# Patient Record
Sex: Male | Born: 1980 | Race: White | Hispanic: No | Smoking: Former smoker
Health system: Southern US, Community
[De-identification: ages and names within clinical notes are randomized; demographics above are authoritative.]

## PROBLEM LIST (undated history)

## (undated) DIAGNOSIS — F32A Depression, unspecified: Secondary | ICD-10-CM

## (undated) DIAGNOSIS — F319 Bipolar disorder, unspecified: Secondary | ICD-10-CM

## (undated) DIAGNOSIS — F329 Major depressive disorder, single episode, unspecified: Secondary | ICD-10-CM

## (undated) DIAGNOSIS — F419 Anxiety disorder, unspecified: Secondary | ICD-10-CM

## (undated) HISTORY — PX: CHOLECYSTECTOMY: SHX55

---

## 2008-11-13 ENCOUNTER — Emergency Department (HOSPITAL_COMMUNITY): Admission: EM | Admit: 2008-11-13 | Discharge: 2008-11-13 | Payer: Self-pay | Admitting: Emergency Medicine

## 2008-11-15 ENCOUNTER — Ambulatory Visit (HOSPITAL_COMMUNITY): Admission: RE | Admit: 2008-11-15 | Discharge: 2008-11-15 | Payer: Self-pay | Admitting: Emergency Medicine

## 2009-04-21 ENCOUNTER — Emergency Department (HOSPITAL_COMMUNITY): Admission: EM | Admit: 2009-04-21 | Discharge: 2009-04-21 | Payer: Self-pay | Admitting: Emergency Medicine

## 2011-01-25 ENCOUNTER — Ambulatory Visit (HOSPITAL_COMMUNITY)
Admission: RE | Admit: 2011-01-25 | Discharge: 2011-01-25 | Disposition: A | Discharge status: Home / Self Care | Payer: Self-pay | Attending: Psychiatry | Admitting: Psychiatry

## 2011-02-02 LAB — RAPID URINE DRUG SCREEN, HOSP PERFORMED
Benzodiazepines: POSITIVE — AB
Cocaine: NOT DETECTED
Opiates: NOT DETECTED
Tetrahydrocannabinol: POSITIVE — AB

## 2011-02-02 LAB — POCT I-STAT, CHEM 8
Chloride: 107 mEq/L (ref 96–112)
HCT: 51 % (ref 39.0–52.0)
Hemoglobin: 17.3 g/dL — ABNORMAL HIGH (ref 13.0–17.0)
Potassium: 4 mEq/L (ref 3.5–5.1)
Sodium: 141 mEq/L (ref 135–145)

## 2011-02-02 LAB — URINALYSIS, ROUTINE W REFLEX MICROSCOPIC
Bilirubin Urine: NEGATIVE
Ketones, ur: 15 mg/dL — AB
Leukocytes, UA: NEGATIVE
Nitrite: NEGATIVE
Protein, ur: NEGATIVE mg/dL

## 2011-02-02 LAB — DIFFERENTIAL
Basophils Absolute: 0 10*3/uL (ref 0.0–0.1)
Basophils Relative: 0 % (ref 0–1)
Eosinophils Absolute: 0 10*3/uL (ref 0.0–0.7)
Monocytes Relative: 6 % (ref 3–12)
Neutro Abs: 15.9 10*3/uL — ABNORMAL HIGH (ref 1.7–7.7)
Neutrophils Relative %: 87 % — ABNORMAL HIGH (ref 43–77)

## 2011-02-02 LAB — BASIC METABOLIC PANEL
BUN: 10 mg/dL (ref 6–23)
CO2: 22 mEq/L (ref 19–32)
Calcium: 9.2 mg/dL (ref 8.4–10.5)
Chloride: 106 mEq/L (ref 96–112)
Creatinine, Ser: 1.19 mg/dL (ref 0.4–1.5)
GFR calc Af Amer: >60 mL/min (ref >60)

## 2011-02-02 LAB — URINE MICROSCOPIC-ADD ON

## 2011-02-02 LAB — CBC
MCHC: 33.2 g/dL (ref 30.0–36.0)
MCV: 91.6 fL (ref 78.0–100.0)
Platelets: 279 10*3/uL (ref 150–400)
RBC: 5.36 MIL/uL (ref 4.22–5.81)

## 2011-03-03 NOTE — Procedures (Signed)
EEG NUMBER:  [id number]   HISTORY:  This is a 30 year old patient with an episode of possible  seizure event.  This is a routine EEG.  No skull defects were noted.   MEDICATIONS:  Xanax.   EEG CLASSIFICATION:  Normal awake and asleep.   DESCRIPTION OF RECORDING:  Background rhythm of this consists of a  fairly well-modulated medium amplitude alpha rhythm of 9 Hz that is  reactive to eye opening and closure.  As the record progresses, the  patient appears to remain in the waking state throughout the recording  until the very end which he enters the drowsy state and may enters early  stage II sleep with vertex sharp wave activity seen.  Photic stimulation  is performed resulting in a bilateral and symmetric photic driving  response.  Hyperventilation is also performed resulting in a minimal  buildup of back rhythm activities without significant slowing seen.  At  no time during the recording does there appear to be evidence of spike  or spike wave discharges or evidence of focal slowing.  EKG monitor  shows no evidence of cardiac rhythm abnormalities with a heart rate of  78.   IMPRESSION:  This is a normal EEG recording in awake and sleeping state.  No evidence of ictal or interictal discharges were seen.      Marlan Palau, M.D.  Electronically Signed     QIO:NGEX  D:  11/15/2008 15:27:52  T:  11/16/2008 04:14:19  Job #:  [id number]

## 2012-07-30 ENCOUNTER — Encounter (HOSPITAL_COMMUNITY): Payer: Self-pay | Admitting: Emergency Medicine

## 2012-07-30 ENCOUNTER — Emergency Department (INDEPENDENT_AMBULATORY_CARE_PROVIDER_SITE_OTHER)
Admission: EM | Admit: 2012-07-30 | Discharge: 2012-07-30 | Disposition: A | Discharge status: Home / Self Care | Payer: Self-pay | Source: Home / Self Care | Attending: Family Medicine | Admitting: Family Medicine

## 2012-07-30 DIAGNOSIS — M549 Dorsalgia, unspecified: Secondary | ICD-10-CM

## 2012-07-30 HISTORY — DX: Anxiety disorder, unspecified: F41.9

## 2012-07-30 HISTORY — DX: Depression, unspecified: F32.A

## 2012-07-30 HISTORY — DX: Bipolar disorder, unspecified: F31.9

## 2012-07-30 HISTORY — DX: Major depressive disorder, single episode, unspecified: F32.9

## 2012-07-30 MED ORDER — CYCLOBENZAPRINE HCL 10 MG PO TABS: 10.0000 mg | ORAL_TABLET | Freq: Two times a day (BID) | ORAL | Status: DC | PRN

## 2012-07-30 MED ORDER — IBUPROFEN 800 MG PO TABS: 800.0000 mg | ORAL_TABLET | Freq: Three times a day (TID) | ORAL | Status: DC

## 2012-07-30 MED ORDER — HYDROCODONE-ACETAMINOPHEN 5-325 MG PO TABS: 2.0000 | ORAL_TABLET | ORAL | Status: DC | PRN

## 2012-07-30 MED ORDER — HYDROCODONE-ACETAMINOPHEN 5-325 MG PO TABS
2.0000 | ORAL_TABLET | Freq: Once | ORAL | Status: AC
  Administered 2012-07-30: 2 via ORAL

## 2012-07-30 MED ORDER — HYDROCODONE-ACETAMINOPHEN 5-325 MG PO TABS
ORAL_TABLET | ORAL | Status: AC
  Filled 2012-07-30: qty 2

## 2012-07-30 NOTE — ED Notes (Signed)
Pt c/o lower back pain since last night... Pt unable to walk and needing assistance, strong odor to urine.... Denies: fevers, vomiting, nauseas, diarrhea, weak, SOB... Pt is alert w/no signs of distress.

## 2012-07-30 NOTE — ED Provider Notes (Signed)
History     CSN: [id number]  Arrival date & time 07/30/12  1215   None     Chief Complaint  Patient presents with  . Back Pain    (Consider location/radiation/quality/duration/timing/severity/associated sxs/prior treatment) Patient is a 31 y.o. male presenting with back pain. The history is provided by the patient. No language interpreter was used.  Back Pain  This is a new problem. The current episode started yesterday. The problem occurs constantly. The problem has been gradually worsening. The pain is associated with no known injury. The pain is present in the lumbar spine. The quality of the pain is described as shooting and aching. The pain is at a severity of 7/10. The pain is moderate. The symptoms are aggravated by bending. Stiffness is present all day. He has tried nothing for the symptoms.    Past Medical History  Diagnosis Date  . Anxiety   . Bipolar 1 disorder   . Depression     Past Surgical History  Procedure Date  . Cholecystectomy     No family history on file.  History  Substance Use Topics  . Smoking status: Current Every Day Smoker -- 0.5 packs/day    Types: Cigarettes  . Smokeless tobacco: Not on file  . Alcohol Use: No      Review of Systems  Musculoskeletal: Positive for back pain.  All other systems reviewed and are negative.    Allergies  Review of patient's allergies indicates no known allergies.  Home Medications   Current Outpatient Rx  Name Route Sig Dispense Refill  . HYDROXYZINE HCL 25 MG PO TABS Oral Take 25 mg by mouth 3 (three) times daily as needed.    Marland Kitchen LATUDA PO Oral Take by mouth.      BP 127/77  Pulse 73  Temp 97.9 F (36.6 C) (Oral)  Resp 18  SpO2 99%  Physical Exam  Nursing note and vitals reviewed. Constitutional: He is oriented to person, place, and time. He appears well-developed and well-nourished.  HENT:  Head: Normocephalic and atraumatic.  Eyes: Conjunctivae normal are normal. Pupils are equal,  round, and reactive to light.  Cardiovascular: Normal rate and normal heart sounds.   Pulmonary/Chest: Effort normal.  Abdominal: Soft.  Musculoskeletal: Normal range of motion.  Neurological: He is alert and oriented to person, place, and time. He has normal reflexes.  Skin: Skin is warm.  Psychiatric: He has a normal mood and affect.    ED Course  Procedures (including critical care time)  Labs Reviewed - No data to display No results found.   No diagnosis found.    MDM  Rx for hydrocodone,  Ibuprofen and flexeril.   Pt advised to see Dr. Ophelia Charter for recheck this week        Elson Areas, [place] 07/30/12 1531

## 2012-07-31 LAB — POCT URINALYSIS DIP (DEVICE)
Bilirubin Urine: NEGATIVE
Glucose, UA: NEGATIVE mg/dL
Ketones, ur: NEGATIVE mg/dL
Leukocytes, UA: NEGATIVE
Protein, ur: NEGATIVE mg/dL
Specific Gravity, Urine: 1.025 (ref 1.005–1.030)

## 2012-08-01 NOTE — ED Provider Notes (Signed)
Medical screening examination/treatment/procedure(s) were performed by non-physician practitioner and as supervising physician I was immediately available for consultation/collaboration.  Leslee Home, M.D.   Reuben Likes, MD 08/01/12 0800

## 2013-03-16 ENCOUNTER — Encounter (HOSPITAL_COMMUNITY): Payer: Self-pay | Admitting: *Deleted

## 2013-03-16 ENCOUNTER — Emergency Department (HOSPITAL_COMMUNITY)
Admission: EM | Admit: 2013-03-16 | Discharge: 2013-03-16 | Disposition: A | Discharge status: Home / Self Care | Payer: Medicaid Other | Attending: Emergency Medicine | Admitting: Emergency Medicine

## 2013-03-16 DIAGNOSIS — M5380 Other specified dorsopathies, site unspecified: Secondary | ICD-10-CM | POA: Insufficient documentation

## 2013-03-16 DIAGNOSIS — Z79899 Other long term (current) drug therapy: Secondary | ICD-10-CM | POA: Insufficient documentation

## 2013-03-16 DIAGNOSIS — M545 Low back pain, unspecified: Secondary | ICD-10-CM | POA: Insufficient documentation

## 2013-03-16 DIAGNOSIS — M62830 Muscle spasm of back: Secondary | ICD-10-CM

## 2013-03-16 DIAGNOSIS — Z8659 Personal history of other mental and behavioral disorders: Secondary | ICD-10-CM | POA: Insufficient documentation

## 2013-03-16 DIAGNOSIS — F17200 Nicotine dependence, unspecified, uncomplicated: Secondary | ICD-10-CM | POA: Insufficient documentation

## 2013-03-16 MED ORDER — IBUPROFEN 800 MG PO TABS: 800.0000 mg | ORAL_TABLET | Freq: Three times a day (TID) | ORAL | Status: DC

## 2013-03-16 MED ORDER — HYDROCODONE-ACETAMINOPHEN 5-325 MG PO TABS: ORAL_TABLET | ORAL | Status: DC

## 2013-03-16 MED ORDER — CYCLOBENZAPRINE HCL 10 MG PO TABS: 10.0000 mg | ORAL_TABLET | Freq: Two times a day (BID) | ORAL | Status: DC | PRN

## 2013-03-16 NOTE — ED Notes (Signed)
Pt c/o right lower back pain. States he went for "a long walk" 4 days ago and woke up with back spasms.

## 2013-03-16 NOTE — ED Notes (Signed)
Pt reports hx of back muscle spasms.  Has taken flexeril, ibuprofen, vicodin without relief.  States (R) lower back pain.

## 2013-03-16 NOTE — ED Provider Notes (Signed)
History  This chart was scribed for non-physician practitioner, Hector Shade, working with Dione Booze, MD by Ardeen Jourdain, ED Scribe. This patient was seen in room TR06C/TR06C and the patient's care was started at 1705.  CSN: [id number]  Arrival date & time 03/16/13  1538   First MD Initiated Contact with Patient 03/16/13 1705      Chief Complaint  Patient presents with  . Back Pain     The history is provided by the patient. No language interpreter was used.    HPI Comments: Evan Thompson is a 32 y.o. male who presents to the Emergency Department complaining of gradual onset, gradually worsening, intermittent back pain that began 2-3 days ago. He states the pain radiates up to his upper back. He rates the pain at a 7/10 currently. He describes the pain as a sharp sensation. He denies any injury or heavy lifting. He reports going on a long walk the day before the pain began and believes the pain was caused by that. He states he has been taking Flexeril with relief. He denies any numbness and tingling of the groin, bladder incontinence or bowel incontinence as associated symptoms.    Past Medical History  Diagnosis Date  . Anxiety   . Bipolar 1 disorder   . Depression     Past Surgical History  Procedure Laterality Date  . Cholecystectomy      History reviewed. No pertinent family history.  History  Substance Use Topics  . Smoking status: Current Every Day Smoker -- 0.50 packs/day    Types: Cigarettes  . Smokeless tobacco: Not on file  . Alcohol Use: No      Review of Systems  Musculoskeletal: Positive for back pain.  All other systems reviewed and are negative.    Allergies  Review of patient's allergies indicates no known allergies.  Home Medications   Current Outpatient Rx  Name  Route  Sig  Dispense  Refill  . cyclobenzaprine (FLEXERIL) 10 MG tablet   Oral   Take 1 tablet (10 mg total) by mouth 2 (two) times daily as needed for muscle spasms.  20 tablet   0   . HYDROcodone-acetaminophen (NORCO/VICODIN) 5-325 MG per tablet   Oral   Take 1 tablet by mouth daily as needed for pain.         Marland Kitchen ibuprofen (ADVIL,MOTRIN) 800 MG tablet   Oral   Take 800 mg by mouth daily as needed for pain.         . cyclobenzaprine (FLEXERIL) 10 MG tablet   Oral   Take 1 tablet (10 mg total) by mouth 2 (two) times daily as needed for muscle spasms.   20 tablet   0   . HYDROcodone-acetaminophen (NORCO/VICODIN) 5-325 MG per tablet      Take 1-2 pills every 6 hours as needed for pain.   6 tablet   0   . ibuprofen (ADVIL,MOTRIN) 800 MG tablet   Oral   Take 1 tablet (800 mg total) by mouth 3 (three) times daily.   21 tablet   0     Triage Vitals: BP 134/83  Pulse 91  Temp(Src) 98.7 F (37.1 C) (Oral)  Resp 18  Ht 6\' 1"  (1.854 m)  Wt 245 lb (111.131 kg)  BMI 32.33 kg/m2  SpO2 97%  Physical Exam  Nursing note and vitals reviewed. Constitutional: He is oriented to person, place, and time. He appears well-developed and well-nourished. No distress.  HENT:  Head: Normocephalic and  atraumatic.  Eyes: EOM are normal. Pupils are equal, round, and reactive to light.  Neck: Normal range of motion. Neck supple. No tracheal deviation present.  Cardiovascular: Normal rate.   Pulmonary/Chest: Effort normal. No respiratory distress.  Abdominal: Soft. He exhibits no distension.  Musculoskeletal: Normal range of motion. He exhibits tenderness. He exhibits no edema.  Antalgic gait. TTP lumbar region on the right side. No bony tenderness along lumbar spine. Pain with movement   Neurological: He is alert and oriented to person, place, and time.  Skin: Skin is warm and dry. No rash noted.  Psychiatric: He has a normal mood and affect. His behavior is normal.    ED Course  Procedures (including critical care time)  DIAGNOSTIC STUDIES: Oxygen Saturation is 97% on room air, normal by my interpretation.    COORDINATION OF CARE:  5:26  PM-Discussed treatment plan which includes pain medication and muscle relaxants with pt at bedside and pt agreed to plan.    Labs Reviewed - No data to display No results found.   1. LBP (low back pain)   2. Muscle spasm of back       MDM  Pt has hx of back muscle spasm, was treated for similar type pain several months ago.  Pt has no red flag symptoms.  No hx of trauma.  No imaging necessary at this time.    Rx: norco, flexeril, and ibuprofen.  Provided pt education on back exercises, muscles spasms, and back injury prevention.   Provided pt info on [facility] to make an appointment as needed for continued back problems.   I personally performed the services described in this documentation, which was scribed in my presence. The recorded information has been reviewed and is accurate.      Junius Finner, PA-C 03/17/13 2047

## 2013-03-18 NOTE — ED Provider Notes (Signed)
Medical screening examination/treatment/procedure(s) were performed by non-physician practitioner and as supervising physician I was immediately available for consultation/collaboration.  Elodia Haviland, MD 03/18/13 0039 

## 2013-03-30 ENCOUNTER — Other Ambulatory Visit: Payer: Self-pay | Admitting: Internal Medicine

## 2013-03-30 DIAGNOSIS — C220 Liver cell carcinoma: Secondary | ICD-10-CM

## 2013-04-06 ENCOUNTER — Ambulatory Visit
Admission: RE | Admit: 2013-04-06 | Discharge: 2013-04-06 | Disposition: A | Discharge status: Home / Self Care | Payer: Medicaid Other | Source: Ambulatory Visit | Attending: Internal Medicine | Admitting: Internal Medicine

## 2013-04-06 DIAGNOSIS — C220 Liver cell carcinoma: Secondary | ICD-10-CM

## 2013-04-18 ENCOUNTER — Emergency Department (HOSPITAL_COMMUNITY)
Admission: EM | Admit: 2013-04-18 | Discharge: 2013-04-18 | Disposition: A | Discharge status: Home / Self Care | Payer: Medicaid Other | Attending: Emergency Medicine | Admitting: Emergency Medicine

## 2013-04-18 ENCOUNTER — Encounter (HOSPITAL_COMMUNITY): Payer: Self-pay | Admitting: *Deleted

## 2013-04-18 DIAGNOSIS — Y929 Unspecified place or not applicable: Secondary | ICD-10-CM | POA: Insufficient documentation

## 2013-04-18 DIAGNOSIS — F17200 Nicotine dependence, unspecified, uncomplicated: Secondary | ICD-10-CM | POA: Insufficient documentation

## 2013-04-18 DIAGNOSIS — Z79899 Other long term (current) drug therapy: Secondary | ICD-10-CM | POA: Insufficient documentation

## 2013-04-18 DIAGNOSIS — X58XXXA Exposure to other specified factors, initial encounter: Secondary | ICD-10-CM | POA: Insufficient documentation

## 2013-04-18 DIAGNOSIS — S39012A Strain of muscle, fascia and tendon of lower back, initial encounter: Secondary | ICD-10-CM

## 2013-04-18 DIAGNOSIS — Y939 Activity, unspecified: Secondary | ICD-10-CM | POA: Insufficient documentation

## 2013-04-18 DIAGNOSIS — S339XXA Sprain of unspecified parts of lumbar spine and pelvis, initial encounter: Secondary | ICD-10-CM | POA: Insufficient documentation

## 2013-04-18 DIAGNOSIS — Z8659 Personal history of other mental and behavioral disorders: Secondary | ICD-10-CM | POA: Insufficient documentation

## 2013-04-18 MED ORDER — HYDROCODONE-ACETAMINOPHEN 5-325 MG PO TABS
1.0000 | ORAL_TABLET | Freq: Once | ORAL | Status: AC
  Administered 2013-04-18: 1 via ORAL
  Filled 2013-04-18: qty 1

## 2013-04-18 MED ORDER — NAPROXEN 500 MG PO TABS: 500.0000 mg | ORAL_TABLET | Freq: Two times a day (BID) | ORAL | Status: DC

## 2013-04-18 MED ORDER — HYDROCODONE-ACETAMINOPHEN 5-325 MG PO TABS: ORAL_TABLET | ORAL | Status: DC

## 2013-04-18 MED ORDER — METHOCARBAMOL 500 MG PO TABS: 1000.0000 mg | ORAL_TABLET | Freq: Four times a day (QID) | ORAL | Status: DC

## 2013-04-18 NOTE — ED Provider Notes (Signed)
History    CSN: [id number] Arrival date & time 04/18/13  0801  First MD Initiated Contact with Patient 04/18/13 [phone]     Chief Complaint  Patient presents with  . Back Pain   (Consider location/radiation/quality/duration/timing/severity/associated sxs/prior Treatment) HPI Comments: Patient presents with a flare of lower back pain, right greater than left, for the past 2 days. Patient has had several flares over the past few months similar to this. Patient describes the pain as aching and does not radiate. Patient denies red flag signs and symptoms of lower back pain. He has been taking left over Flexeril and 800 mg ibuprofen without relief. Patient denies acute injury but states that he was swimming prior to when the pain started. The onset of this condition was acute. The course is constant. Aggravating factors: movement. Alleviating factors: none.    Patient is a 32 y.o. male presenting with back pain. The history is provided by the patient.  Back Pain Associated symptoms: no fever, no numbness and no weakness    Past Medical History  Diagnosis Date  . Anxiety   . Bipolar 1 disorder   . Depression    Past Surgical History  Procedure Laterality Date  . Cholecystectomy     No family history on file. History  Substance Use Topics  . Smoking status: Current Every Day Smoker -- 0.50 packs/day    Types: Cigarettes  . Smokeless tobacco: Not on file  . Alcohol Use: No    Review of Systems  Constitutional: Negative for fever and unexpected weight change.  Gastrointestinal: Negative for constipation.       Neg for fecal incontinence  Genitourinary: Negative for hematuria, flank pain and difficulty urinating.       Negative for urinary incontinence or retention  Musculoskeletal: Positive for back pain.  Neurological: Negative for weakness and numbness.       Negative for saddle paresthesias     Allergies  Review of patient's allergies indicates no known allergies.  Home  Medications   Current Outpatient Rx  Name  Route  Sig  Dispense  Refill  . cyclobenzaprine (FLEXERIL) 10 MG tablet   Oral   Take 1 tablet (10 mg total) by mouth 2 (two) times daily as needed for muscle spasms.   20 tablet   0   . ibuprofen (ADVIL,MOTRIN) 800 MG tablet   Oral   Take 1 tablet (800 mg total) by mouth 3 (three) times daily.   21 tablet   0   . ranitidine (ZANTAC) 150 MG tablet   Oral   Take 150 mg by mouth daily as needed for heartburn.         Marland Kitchen HYDROcodone-acetaminophen (NORCO/VICODIN) 5-325 MG per tablet      Take 1-2 tablets every 6 hours as needed for severe pain   10 tablet   0   . methocarbamol (ROBAXIN) 500 MG tablet   Oral   Take 2 tablets (1,000 mg total) by mouth 4 (four) times daily.   20 tablet   0   . naproxen (NAPROSYN) 500 MG tablet   Oral   Take 1 tablet (500 mg total) by mouth 2 (two) times daily.   20 tablet   0    BP 130/70  Pulse 74  Temp(Src) 98.2 F (36.8 C) (Oral)  SpO2 100% Physical Exam  Nursing note and vitals reviewed. Constitutional: He appears well-developed and well-nourished.  HENT:  Head: Normocephalic and atraumatic.  Eyes: Conjunctivae are normal.  Neck: Normal  range of motion.  Abdominal: Soft. There is no tenderness. There is no CVA tenderness.  Musculoskeletal: Normal range of motion. He exhibits no tenderness.       Back:  No step-off noted with palpation of spine.   Neurological: He is alert. He has normal reflexes. No sensory deficit. He exhibits normal muscle tone.  5/5 strength in entire lower extremities bilaterally. No sensation deficit.   Skin: Skin is warm and dry.  Psychiatric: He has a normal mood and affect.    ED Course  Procedures (including critical care time) Labs Reviewed - No data to display No results found. 1. Lumbosacral strain, initial encounter    10:01 AM Patient seen and examined. Work-up initiated. Medications ordered.   Vital signs reviewed and are as follows: Filed  Vitals:   04/18/13 0804  BP: 130/70  Pulse: 74  Temp: 98.2 F (36.8 C)   No red flag s/s of low back pain. Patient was counseled on back pain precautions and told to do activity as tolerated but do not lift, push, or pull heavy objects more than 10 pounds for the next week.  Patient counseled to use ice or heat on back for no longer than 15 minutes every hour.   Patient prescribed muscle relaxer and counseled on proper use of muscle relaxant medication.    Patient prescribed narcotic pain medicine and counseled on proper use of narcotic pain medications. Counseled not to combine this medication with others containing tylenol.   Urged patient not to drink alcohol, drive, or perform any other activities that requires focus while taking either of these medications.  Patient urged to follow-up with PCP if pain does not improve with treatment and rest or if pain becomes recurrent. Urged to return with worsening severe pain, loss of bowel or bladder control, trouble walking.   The patient verbalizes understanding and agrees with the plan.  MDM  Patient with back pain. No neurological deficits. Patient is ambulatory. No warning symptoms of back pain including: loss of bowel or bladder control, night sweats, waking from sleep with back pain, unexplained fevers or weight loss, h/o cancer, IVDU, recent trauma. No concern for cauda equina, epidural abscess, or other serious cause of back pain. Conservative measures such as rest, ice/heat and pain medicine indicated with PCP follow-up if no improvement with conservative management.    Renne Crigler, PA-C 04/18/13 1004

## 2013-04-18 NOTE — ED Provider Notes (Signed)
Medical screening examination/treatment/procedure(s) were performed by non-physician practitioner and as supervising physician I was immediately available for consultation/collaboration.  Thierry Dobosz, MD 04/18/13 1324 

## 2013-04-18 NOTE — ED Notes (Signed)
Pt is here with lower back pain for the last 2 days and tried taking flexeril and ibuprofen from previous visit. No abdominal pain or pain with urination.

## 2013-05-02 ENCOUNTER — Emergency Department (HOSPITAL_COMMUNITY)
Admission: EM | Admit: 2013-05-02 | Discharge: 2013-05-02 | Disposition: A | Discharge status: Home / Self Care | Payer: Medicaid Other | Attending: Emergency Medicine | Admitting: Emergency Medicine

## 2013-05-02 ENCOUNTER — Encounter (HOSPITAL_COMMUNITY): Payer: Self-pay | Admitting: *Deleted

## 2013-05-02 DIAGNOSIS — H109 Unspecified conjunctivitis: Secondary | ICD-10-CM

## 2013-05-02 DIAGNOSIS — Z79899 Other long term (current) drug therapy: Secondary | ICD-10-CM | POA: Insufficient documentation

## 2013-05-02 DIAGNOSIS — IMO0002 Reserved for concepts with insufficient information to code with codable children: Secondary | ICD-10-CM | POA: Insufficient documentation

## 2013-05-02 DIAGNOSIS — F17200 Nicotine dependence, unspecified, uncomplicated: Secondary | ICD-10-CM | POA: Insufficient documentation

## 2013-05-02 DIAGNOSIS — H5789 Other specified disorders of eye and adnexa: Secondary | ICD-10-CM | POA: Insufficient documentation

## 2013-05-02 DIAGNOSIS — Z8659 Personal history of other mental and behavioral disorders: Secondary | ICD-10-CM | POA: Insufficient documentation

## 2013-05-02 MED ORDER — CIPROFLOXACIN HCL 0.3 % OP SOLN: 1.0000 [drp] | OPHTHALMIC | Status: DC

## 2013-05-02 NOTE — ED Notes (Signed)
Pt c/o burning, itching eyes, that sometimes water, sometime produce exudate. Some relief with allergy medication. Denies vision difficulty

## 2013-05-02 NOTE — ED Provider Notes (Signed)
History    This chart was scribed for non-physician practitioner  Trevor Mace, PA-C, working with Laray Anger, DO by Donne Anon, ED Scribe. This patient was seen in room TR08C/TR08C and the patient's care was started at 1953.  CSN: [id number] Arrival date & time 05/02/13  1946  First MD Initiated Contact with Patient 05/02/13 1953     Chief Complaint  Patient presents with  . Burning Eyes    The history is provided by the patient. No language interpreter was used.   HPI Comments: Evan Thompson is a 32 y.o. male who presents to the Emergency Department complaining of a burning and itching left eye that sometimes waters and produces exudate. He has tried Benadryl that provided relief of the discharge but not of the itching. He denies blurred vision or any other pain. He wears contacts, and he notices that when he wears the contacts the itching becomes worse.   Past Medical History  Diagnosis Date  . Anxiety   . Bipolar 1 disorder   . Depression    Past Surgical History  Procedure Laterality Date  . Cholecystectomy     No family history on file. History  Substance Use Topics  . Smoking status: Current Every Day Smoker -- 0.50 packs/day    Types: Cigarettes  . Smokeless tobacco: Not on file  . Alcohol Use: No    Review of Systems  Eyes: Positive for discharge and itching. Negative for visual disturbance.  All other systems reviewed and are negative.    Allergies  Review of patient's allergies indicates no known allergies.  Home Medications   Current Outpatient Rx  Name  Route  Sig  Dispense  Refill  . cyclobenzaprine (FLEXERIL) 10 MG tablet   Oral   Take 1 tablet (10 mg total) by mouth 2 (two) times daily as needed for muscle spasms.   20 tablet   0   . HYDROcodone-acetaminophen (NORCO/VICODIN) 5-325 MG per tablet      Take 1-2 tablets every 6 hours as needed for severe pain   10 tablet   0   . ibuprofen (ADVIL,MOTRIN) 800 MG tablet   Oral   Take  1 tablet (800 mg total) by mouth 3 (three) times daily.   21 tablet   0   . methocarbamol (ROBAXIN) 500 MG tablet   Oral   Take 2 tablets (1,000 mg total) by mouth 4 (four) times daily.   20 tablet   0   . naproxen (NAPROSYN) 500 MG tablet   Oral   Take 1 tablet (500 mg total) by mouth 2 (two) times daily.   20 tablet   0   . ranitidine (ZANTAC) 150 MG tablet   Oral   Take 150 mg by mouth daily as needed for heartburn.          BP 131/85  Pulse 103  Temp(Src) 98.8 F (37.1 C) (Oral)  Resp 20  SpO2 98% Physical Exam  Nursing note and vitals reviewed. Constitutional: He is oriented to person, place, and time. He appears well-developed and well-nourished. No distress.  HENT:  Head: Normocephalic and atraumatic.  Eyes: EOM are normal. Pupils are equal, round, and reactive to light. Left eye exhibits discharge. Left conjunctiva is injected.  Left conjunctiva injected with yellow purulent drainage.  Neck: Normal range of motion. Neck supple.  Cardiovascular: Normal rate, regular rhythm and normal heart sounds.   Pulmonary/Chest: Effort normal and breath sounds normal.  Musculoskeletal: Normal range of  motion. He exhibits no edema.  Neurological: He is alert and oriented to person, place, and time.  Skin: Skin is warm and dry.  Psychiatric: He has a normal mood and affect. His behavior is normal.    ED Course  Procedures (including critical care time) DIAGNOSTIC STUDIES: Oxygen Saturation is 98% on RA, normal by my interpretation.    COORDINATION OF CARE: 7:59 PM Discussed treatment plan which includes antibiotic eye ointment with pt at bedside and pt agreed to plan. Advised pt to not use contacts until the antibiotic course is finished and to throw out the contacts that he is currently using.   Labs Reviewed - No data to display No results found. 1. Conjunctivitis     MDM  Patient with conjunctivitis. He is a contact lens wearer and will treat with Cipro.  Infection care precautions discussed. Patient is understanding of plan and is agreeable.    I personally performed the services described in this documentation, which was scribed in my presence. The recorded information has been reviewed and is accurate.   Trevor Mace, PA-C 05/02/13 2010

## 2013-05-03 NOTE — ED Provider Notes (Signed)
Medical screening examination/treatment/procedure(s) were performed by non-physician practitioner and as supervising physician I was immediately available for consultation/collaboration.   Jocabed Cheese M Gaynor Genco, DO 05/03/13 1550 

## 2013-12-06 ENCOUNTER — Emergency Department (HOSPITAL_COMMUNITY)
Admission: EM | Admit: 2013-12-06 | Discharge: 2013-12-06 | Disposition: A | Discharge status: Home / Self Care | Payer: Medicaid Other | Attending: Emergency Medicine | Admitting: Emergency Medicine

## 2013-12-06 ENCOUNTER — Encounter (HOSPITAL_COMMUNITY): Payer: Self-pay | Admitting: Emergency Medicine

## 2013-12-06 DIAGNOSIS — F411 Generalized anxiety disorder: Secondary | ICD-10-CM | POA: Insufficient documentation

## 2013-12-06 DIAGNOSIS — F319 Bipolar disorder, unspecified: Secondary | ICD-10-CM | POA: Insufficient documentation

## 2013-12-06 DIAGNOSIS — Z79899 Other long term (current) drug therapy: Secondary | ICD-10-CM | POA: Insufficient documentation

## 2013-12-06 DIAGNOSIS — Z791 Long term (current) use of non-steroidal anti-inflammatories (NSAID): Secondary | ICD-10-CM | POA: Insufficient documentation

## 2013-12-06 DIAGNOSIS — J020 Streptococcal pharyngitis: Secondary | ICD-10-CM | POA: Insufficient documentation

## 2013-12-06 DIAGNOSIS — F17200 Nicotine dependence, unspecified, uncomplicated: Secondary | ICD-10-CM | POA: Insufficient documentation

## 2013-12-06 LAB — RAPID STREP SCREEN (MED CTR MEBANE ONLY): Streptococcus, Group A Screen (Direct): POSITIVE — AB

## 2013-12-06 MED ORDER — MENTHOL 3 MG MT LOZG
1.0000 | LOZENGE | OROMUCOSAL | Status: DC | PRN
  Administered 2013-12-06: 3 mg via ORAL
  Filled 2013-12-06: qty 9

## 2013-12-06 MED ORDER — PENICILLIN G BENZATHINE 1200000 UNIT/2ML IM SUSP
1.2000 10*6.[IU] | Freq: Once | INTRAMUSCULAR | Status: AC
  Administered 2013-12-06: 1.2 10*6.[IU] via INTRAMUSCULAR
  Filled 2013-12-06: qty 2

## 2013-12-06 MED ORDER — ACETAMINOPHEN 325 MG PO TABS
650.0000 mg | ORAL_TABLET | Freq: Once | ORAL | Status: AC
  Administered 2013-12-06: 650 mg via ORAL
  Filled 2013-12-06: qty 2

## 2013-12-06 NOTE — ED Provider Notes (Signed)
CSN: [id number]     Arrival date & time 12/06/13  0448 History   First MD Initiated Contact with Patient 12/06/13 0459     Chief Complaint  Patient presents with  . Sore Throat     (Consider location/radiation/quality/duration/timing/severity/associated sxs/prior Treatment) HPI 33 year old male presents to emergency room with complaint of left-sided throat pain.  Pain with swallowing.  He does report some URI symptoms, such as runny nose cough, congestion.  He is unsure if he has had fever.  He is tried cough drops, without improvement. Past Medical History  Diagnosis Date  . Anxiety   . Bipolar 1 disorder   . Depression    Past Surgical History  Procedure Laterality Date  . Cholecystectomy     No family history on file. History  Substance Use Topics  . Smoking status: Current Every Day Smoker -- 0.50 packs/day    Types: Cigarettes  . Smokeless tobacco: Not on file  . Alcohol Use: No    Review of Systems  See History of Present Illness; otherwise all other systems are reviewed and negative   Allergies  Review of patient's allergies indicates no known allergies.  Home Medications   Current Outpatient Rx  Name  Route  Sig  Dispense  Refill  . ARIPiprazole (ABILIFY) 10 MG tablet   Oral   Take 10 mg by mouth daily.         . ciprofloxacin (CILOXAN) 0.3 % ophthalmic solution   Left Eye   Place 1 drop into the left eye every 2 (two) hours. Administer 2 drops in L eye every 4 hours x 5 days.   5 mL   0   . cyclobenzaprine (FLEXERIL) 10 MG tablet   Oral   Take 1 tablet (10 mg total) by mouth 2 (two) times daily as needed for muscle spasms.   20 tablet   0   . diphenhydrAMINE (BENADRYL) 25 MG tablet   Oral   Take 25 mg by mouth every 6 (six) hours as needed for itching.         . hydrOXYzine (ATARAX/VISTARIL) 25 MG tablet   Oral   Take 25 mg by mouth daily as needed for anxiety.         . methocarbamol (ROBAXIN) 500 MG tablet   Oral   Take 2 tablets  (1,000 mg total) by mouth 4 (four) times daily.   20 tablet   0   . naproxen (NAPROSYN) 500 MG tablet   Oral   Take 1 tablet (500 mg total) by mouth 2 (two) times daily.   20 tablet   0   . ranitidine (ZANTAC) 150 MG tablet   Oral   Take 150 mg by mouth daily as needed for heartburn.          BP 133/92  Pulse 105  Temp(Src) 98.4 F (36.9 C) (Oral)  Resp 18  SpO2 97% Physical Exam  Nursing note and vitals reviewed. Constitutional: He is oriented to person, place, and time. He appears well-developed and well-nourished.  HENT:  Head: Normocephalic and atraumatic.  Right Ear: External ear normal.  Left Ear: External ear normal.  Nose: Nose normal.  Mouth/Throat: Oropharyngeal exudate (mild erythema and exudate noted on bilateral tonsils.  No uvula deviationarea trismus.  Patient is handling secretions well) present.  Eyes: Conjunctivae and EOM are normal. Pupils are equal, round, and reactive to light.  Neck: Normal range of motion. Neck supple. No JVD present. No tracheal deviation present. No thyromegaly  present.  Cardiovascular: Normal rate, regular rhythm, normal heart sounds and intact distal pulses.  Exam reveals no gallop and no friction rub.   No murmur heard. Pulmonary/Chest: Effort normal and breath sounds normal. No stridor. No respiratory distress. He has no wheezes. He has no rales. He exhibits no tenderness.  Abdominal: Soft. Bowel sounds are normal. He exhibits no distension and no mass. There is no tenderness. There is no rebound and no guarding.  Musculoskeletal: Normal range of motion. He exhibits no edema and no tenderness.  Lymphadenopathy:    He has no cervical adenopathy.  Neurological: He is alert and oriented to person, place, and time. He exhibits normal muscle tone. Coordination normal.  Skin: Skin is warm and dry. No rash noted. No erythema. No pallor.  Psychiatric: He has a normal mood and affect. His behavior is normal. Judgment and thought content  normal.    ED Course  Procedures (including critical care time) Labs Review Labs Reviewed  RAPID STREP SCREEN - Abnormal; Notable for the following:    Streptococcus, Group A Screen (Direct) POSITIVE (*)    All other components within normal limits   Imaging Review No results found.  EKG Interpretation   None       MDM   Final diagnoses:  Strep throat    33 year old male with strep throat.  Patient given option of 10 days of antibiotics or IM penicillin.  He has no previous allergies.    Kalman Drape, MD 12/06/13 [phone]

## 2013-12-06 NOTE — ED Notes (Signed)
Pt reports left sided sore throat and is tender to push on the left side of his neck and it hurts to swallow.. Throat appears red and with white spots. Denies fever.

## 2013-12-06 NOTE — Discharge Instructions (Signed)
You were given a shot of penicillin today, which should cure your strep throat.  Return emergency room for worsening condition or new concerning symptoms.  Drink plenty of fluids.  Warm saltwater gargles will help with pain as will Cepacol Lozenges and/or throat numbing spray, available over-the-counter.   Pharyngitis Pharyngitis is redness, pain, and swelling (inflammation) of your pharynx.  CAUSES  Pharyngitis is usually caused by infection. Most of the time, these infections are from viruses (viral) and are part of a cold. However, sometimes pharyngitis is caused by bacteria (bacterial). Pharyngitis can also be caused by allergies. Viral pharyngitis may be spread from person to person by coughing, sneezing, and personal items or utensils (cups, forks, spoons, toothbrushes). Bacterial pharyngitis may be spread from person to person by more intimate contact, such as kissing.  SIGNS AND SYMPTOMS  Symptoms of pharyngitis include:   Sore throat.   Tiredness (fatigue).   Low-grade fever.   Headache.  Joint pain and muscle aches.  Skin rashes.  Swollen lymph nodes.  Plaque-like film on throat or tonsils (often seen with bacterial pharyngitis). DIAGNOSIS  Your health care provider will ask you questions about your illness and your symptoms. Your medical history, along with a physical exam, is often all that is needed to diagnose pharyngitis. Sometimes, a rapid strep test is done. Other lab tests may also be done, depending on the suspected cause.  TREATMENT  Viral pharyngitis will usually get better in 3 4 days without the use of medicine. Bacterial pharyngitis is treated with medicines that kill germs (antibiotics).  HOME CARE INSTRUCTIONS   Drink enough water and fluids to keep your urine clear or pale yellow.   Only take over-the-counter or prescription medicines as directed by your health care provider:   If you are prescribed antibiotics, make sure you finish them even if you  start to feel better.   Do not take aspirin.   Get lots of rest.   Gargle with 8 oz of salt water ( tsp of salt per 1 qt of water) as often as every 1 2 hours to soothe your throat.   Throat lozenges (if you are not at risk for choking) or sprays may be used to soothe your throat. SEEK MEDICAL CARE IF:   You have large, tender lumps in your neck.  You have a rash.  You cough up green, yellow-brown, or bloody spit. SEEK IMMEDIATE MEDICAL CARE IF:   Your neck becomes stiff.  You drool or are unable to swallow liquids.  You vomit or are unable to keep medicines or liquids down.  You have severe pain that does not go away with the use of recommended medicines.  You have trouble breathing (not caused by a stuffy nose). MAKE SURE YOU:   Understand these instructions.  Will watch your condition.  Will get help right away if you are not doing well or get worse. Document Released: 10/05/2005 Document Revised: 07/26/2013 Document Reviewed: 06/12/2013 [facility] Patient Information 2014 [place].  Salt Water Gargle This solution will help make your mouth and throat feel better. HOME CARE INSTRUCTIONS   Mix 1 teaspoon of salt in 8 ounces of warm water.  Gargle with this solution as much or often as you need or as directed. Swish and gargle gently if you have any sores or wounds in your mouth.  Do not swallow this mixture. Document Released: 07/09/2004 Document Revised: 12/28/2011 Document Reviewed: 11/30/2008 [facility] Patient Information 2014 [place].

## 2014-10-11 ENCOUNTER — Encounter (HOSPITAL_COMMUNITY): Payer: Self-pay

## 2014-10-11 ENCOUNTER — Emergency Department (HOSPITAL_COMMUNITY)
Admission: EM | Admit: 2014-10-11 | Discharge: 2014-10-11 | Disposition: A | Discharge status: Home / Self Care | Payer: Medicaid Other | Attending: Emergency Medicine | Admitting: Emergency Medicine

## 2014-10-11 DIAGNOSIS — F319 Bipolar disorder, unspecified: Secondary | ICD-10-CM | POA: Diagnosis not present

## 2014-10-11 DIAGNOSIS — K047 Periapical abscess without sinus: Secondary | ICD-10-CM | POA: Diagnosis not present

## 2014-10-11 DIAGNOSIS — Z720 Tobacco use: Secondary | ICD-10-CM | POA: Diagnosis not present

## 2014-10-11 DIAGNOSIS — Z79899 Other long term (current) drug therapy: Secondary | ICD-10-CM | POA: Diagnosis not present

## 2014-10-11 DIAGNOSIS — R630 Anorexia: Secondary | ICD-10-CM | POA: Diagnosis not present

## 2014-10-11 DIAGNOSIS — F419 Anxiety disorder, unspecified: Secondary | ICD-10-CM | POA: Diagnosis not present

## 2014-10-11 DIAGNOSIS — K029 Dental caries, unspecified: Secondary | ICD-10-CM | POA: Insufficient documentation

## 2014-10-11 DIAGNOSIS — K088 Other specified disorders of teeth and supporting structures: Secondary | ICD-10-CM | POA: Diagnosis present

## 2014-10-11 MED ORDER — TRAMADOL HCL 50 MG PO TABS
50.0000 mg | ORAL_TABLET | Freq: Once | ORAL | Status: AC
  Administered 2014-10-11: 50 mg via ORAL
  Filled 2014-10-11: qty 1

## 2014-10-11 MED ORDER — PENICILLIN V POTASSIUM 500 MG PO TABS: 500.0000 mg | ORAL_TABLET | Freq: Four times a day (QID) | ORAL | Status: DC

## 2014-10-11 MED ORDER — TRAMADOL HCL 50 MG PO TABS: 50.0000 mg | ORAL_TABLET | Freq: Three times a day (TID) | ORAL | Status: DC | PRN

## 2014-10-11 MED ORDER — PENICILLIN V POTASSIUM 500 MG PO TABS
500.0000 mg | ORAL_TABLET | Freq: Once | ORAL | Status: AC
  Administered 2014-10-11: 500 mg via ORAL
  Filled 2014-10-11: qty 1

## 2014-10-11 NOTE — ED Provider Notes (Signed)
CSN: [id number]     Arrival date & time 10/11/14  1905 History  This chart was scribed for non-physician practitioner working with Carmin Muskrat, MD, by Peyton Bottoms ED Scribe. This patient was seen in room WTR5/WTR5 and the patient's care was started at 8:18 PM  Chief Complaint  Patient presents with  . Dental Pain   The history is provided by the patient. No language interpreter was used.    HPI Comments: Evan Thompson is a 33 y.o. male who presents to the Emergency Department complaining of left upper tooth pain that began 3 days ago. He states that it hurts to sleep and eat. He has not been seen by the dentist yet.  Past Medical History  Diagnosis Date  . Anxiety   . Bipolar 1 disorder   . Depression    Past Surgical History  Procedure Laterality Date  . Cholecystectomy     History reviewed. No pertinent family history. History  Substance Use Topics  . Smoking status: Current Every Day Smoker -- 0.50 packs/day    Types: Cigarettes  . Smokeless tobacco: Not on file  . Alcohol Use: No   Review of Systems  Constitutional: Positive for appetite change. Negative for chills and fatigue.  HENT: Positive for dental problem. Negative for rhinorrhea and sore throat.   Respiratory: Negative for cough.   Cardiovascular: Negative for chest pain.  Gastrointestinal: Negative for nausea, vomiting and diarrhea.  Genitourinary: Negative for dysuria.  Musculoskeletal: Negative for back pain.  Skin: Negative for rash.  Psychiatric/Behavioral: Negative for confusion.  All other systems reviewed and are negative.  Allergies  Review of patient's allergies indicates no known allergies.  Home Medications   Prior to Admission medications   Medication Sig Start Date End Date Taking? Authorizing Provider  ibuprofen (ADVIL,MOTRIN) 200 MG tablet Take 400-1,000 mg by mouth every 6 (six) hours as needed (pain).   Yes Historical Provider, MD  ARIPiprazole (ABILIFY) 10 MG tablet Take 10 mg by  mouth daily.    Historical Provider, MD  hydrOXYzine (ATARAX/VISTARIL) 25 MG tablet Take 25 mg by mouth daily as needed for anxiety.    Historical Provider, MD  penicillin v potassium (VEETID) 500 MG tablet Take 1 tablet (500 mg total) by mouth 4 (four) times daily. 10/11/14   Garald Balding, NP  Pseudoeph-Doxylamine-DM-APAP 30-6.25-15-325 MG CAPS Take 2 capsules by mouth once.    Historical Provider, MD  traMADol (ULTRAM) 50 MG tablet Take 1 tablet (50 mg total) by mouth 3 (three) times daily as needed. 10/11/14   Garald Balding, NP   Triage Vitals: BP 116/72 mmHg  Pulse 88  Temp(Src) 98 F (36.7 C) (Oral)  SpO2 100%  Physical Exam  Constitutional: He is oriented to person, place, and time. He appears well-developed and well-nourished. No distress.  HENT:  Head: Normocephalic and atraumatic.  Mouth/Throat:    Abscess to left second molar.  Eyes: Conjunctivae and EOM are normal.  Neck: Neck supple. No tracheal deviation present.  Cardiovascular: Normal rate.   Pulmonary/Chest: Effort normal. No respiratory distress.  Musculoskeletal: Normal range of motion.  Neurological: He is alert and oriented to person, place, and time.  Skin: Skin is warm and dry.  Psychiatric: He has a normal mood and affect. His behavior is normal.  Nursing note and vitals reviewed.   ED Course  Procedures (including critical care time)  DIAGNOSTIC STUDIES: Oxygen Saturation is 100% on RA, normal by my interpretation.    COORDINATION OF CARE: 8:17 PM-  Discussed plans to give medication for pain management. Pt advised of plan for treatment and pt agrees.  Labs Review Labs Reviewed - No data to display  Imaging Review No results found.   EKG Interpretation None     MDM   Final diagnoses:  Dental abscess  Dental caries       I personally performed the services described in this documentation, which was scribed in my presence. The recorded information has been reviewed and is  accurate.  Garald Balding, NP 10/11/14 2030  Carmin Muskrat, MD 10/11/14 2103

## 2014-10-11 NOTE — Discharge Instructions (Signed)
Abscessed Tooth An abscessed tooth is an infection around your tooth. It may be caused by holes or damage to the tooth (cavity) or a dental disease. An abscessed tooth causes mild to very bad pain in and around the tooth. See your dentist right away if you have tooth or gum pain. HOME CARE  Take your medicine as told. Finish it even if you start to feel better.  Do not drive after taking pain medicine.  Rinse your mouth (gargle) often with salt water ( teaspoon salt in 8 ounces of warm water).  Do not apply heat to the outside of your face. GET HELP RIGHT AWAY IF:   You have a temperature by mouth above 102 F (38.9 C), not controlled by medicine.  You have chills and a very bad headache.  You have problems breathing or swallowing.  Your mouth will not open.  You develop puffiness (swelling) on the neck or around the eye.  Your pain is not helped by medicine.  Your pain is getting worse instead of better. MAKE SURE YOU:   Understand these instructions.  Will watch your condition.  Will get help right away if you are not doing well or get worse. Document Released: 03/23/2008 Document Revised: 12/28/2011 Document Reviewed: 01/13/2011 [facility] Patient Information 2015 [place], [place]. This information is not intended to replace advice given to you by your health care provider. Make sure you discuss any questions you have with your health care provider.  Dental Care and Dentist Visits Dental care supports good overall health. Regular dental visits can also help you avoid dental pain, bleeding, infection, and other more serious health problems in the future. It is important to keep the mouth healthy because diseases in the teeth, gums, and other oral tissues can spread to other areas of the body. Some problems, such as diabetes, heart disease, and pre-term labor have been associated with poor oral health.  See your dentist every 6 months. If you experience emergency problems such  as a toothache or broken tooth, go to the dentist right away. If you see your dentist regularly, you may catch problems early. It is easier to be treated for problems in the early stages.  WHAT TO EXPECT AT A DENTIST VISIT  Your dentist will look for many common oral health problems and recommend proper treatment. At your regular dental visit, you can expect:  Gentle cleaning of the teeth and gums. This includes scraping and polishing. This helps to remove the sticky substance around the teeth and gums (plaque). Plaque forms in the mouth shortly after eating. Over time, plaque hardens on the teeth as tartar. If tartar is not removed regularly, it can cause problems. Cleaning also helps remove stains.  Periodic X-rays. These pictures of the teeth and supporting bone will help your dentist assess the health of your teeth.  Periodic fluoride treatments. Fluoride is a natural mineral shown to help strengthen teeth. Fluoride treatmentinvolves applying a fluoride gel or varnish to the teeth. It is most commonly done in children.  Examination of the mouth, tongue, jaws, teeth, and gums to look for any oral health problems, such as:  Cavities (dental caries). This is decay on the tooth caused by plaque, sugar, and acid in the mouth. It is best to catch a cavity when it is small.  Inflammation of the gums caused by plaque buildup (gingivitis).  Problems with the mouth or malformed or misaligned teeth.  Oral cancer or other diseases of the soft tissues or jaws.  KEEP YOUR TEETH AND GUMS HEALTHY For healthy teeth and gums, follow these general guidelines as well as your dentist's specific advice:  Have your teeth professionally cleaned at the dentist every 6 months.  Brush twice daily with a fluoride toothpaste.  Floss your teeth daily.  Ask your dentist if you need fluoride supplements, treatments, or fluoride toothpaste.  Eat a healthy diet. Reduce foods and drinks with added sugar.  Avoid  smoking. TREATMENT FOR ORAL HEALTH PROBLEMS If you have oral health problems, treatment varies depending on the conditions present in your teeth and gums.  Your caregiver will most likely recommend good oral hygiene at each visit.  For cavities, gingivitis, or other oral health disease, your caregiver will perform a procedure to treat the problem. This is typically done at a separate appointment. Sometimes your caregiver will refer you to another dental specialist for specific tooth problems or for surgery. SEEK IMMEDIATE DENTAL CARE IF:  You have pain, bleeding, or soreness in the gum, tooth, jaw, or mouth area.  A permanent tooth becomes loose or separated from the gum socket.  You experience a blow or injury to the mouth or jaw area. Document Released: 06/17/2011 Document Revised: 12/28/2011 Document Reviewed: 06/17/2011 [facility] Patient Information 2015 [place], [place]. This information is not intended to replace advice given to you by your health care provider. Make sure you discuss any questions you have with your health care provider. Call the dentist and make an appointment  Tell them you are being referred through the ED

## 2014-10-11 NOTE — ED Notes (Signed)
Patient reports dental pain x 3 days that is affecting his sleeping and eating.  Dental caries noted.

## 2015-01-22 IMAGING — US US ABDOMEN LIMITED
1 series · 14 of 25 positions shown · non-contrast
Comparison: None.

CLINICAL DATA: Hepatitis C. Hepatoma screening

LIMITED ABDOMINAL ULTRASOUND

[Series 1: us abdomen limited · 0.29mm/px · 14 of 37 slices shown]
[im 1/37]
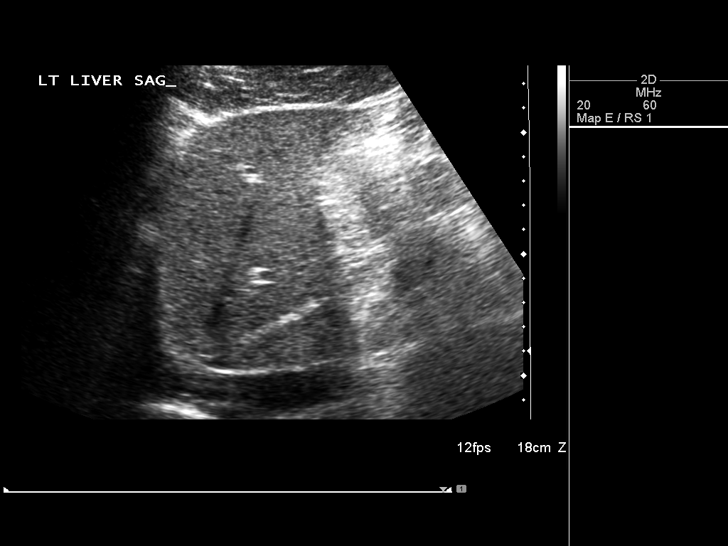
[im 4/37]
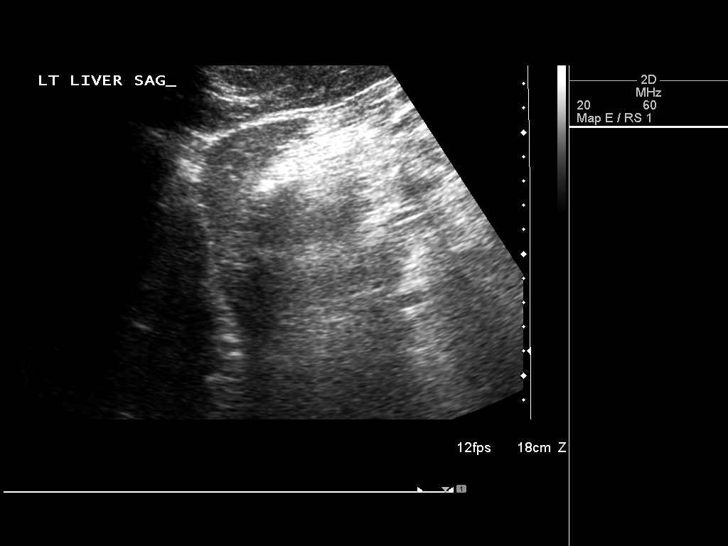
[im 7/37]
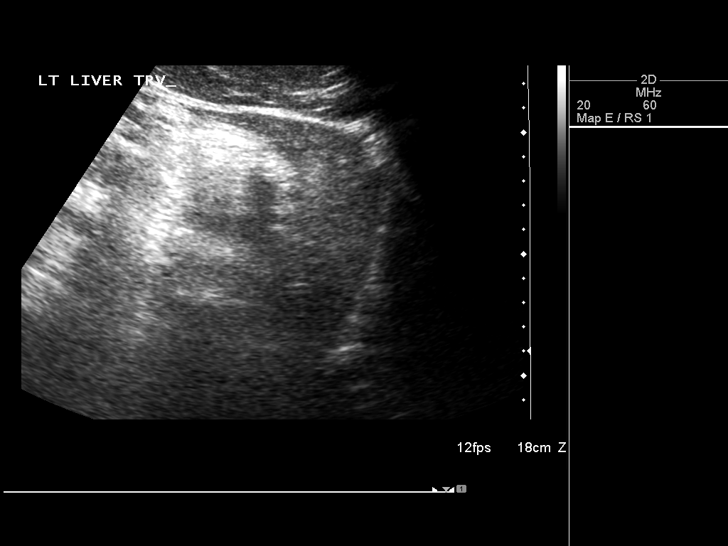
[im 10/37]
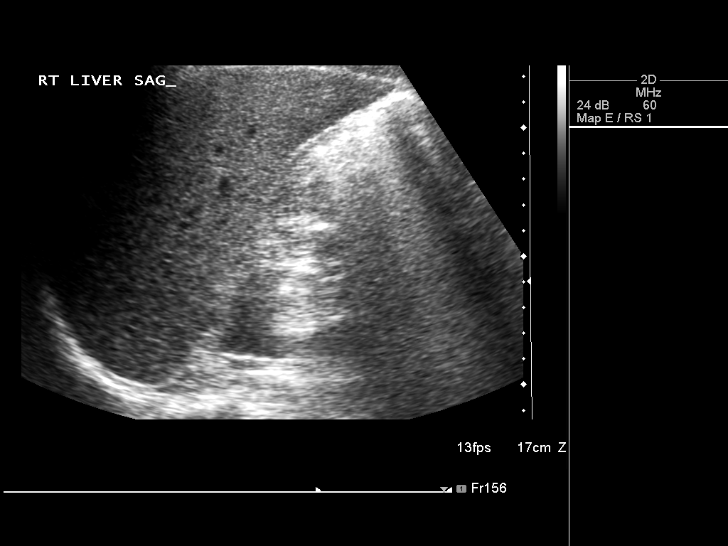
[im 13/37]
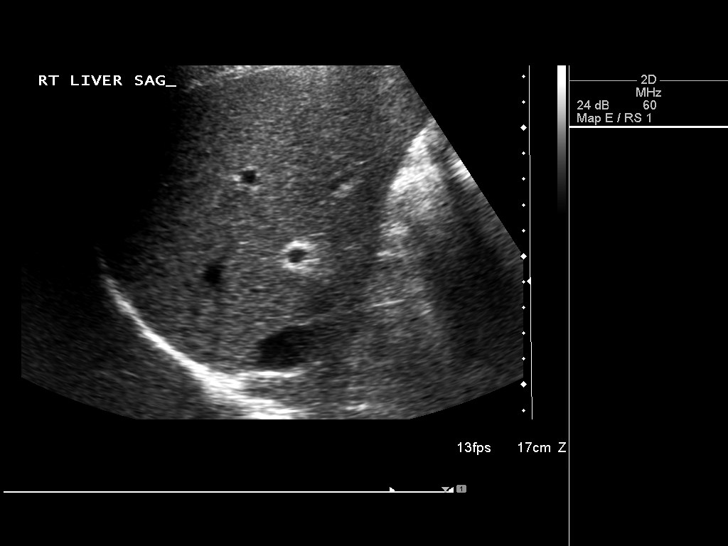
[im 14/37]
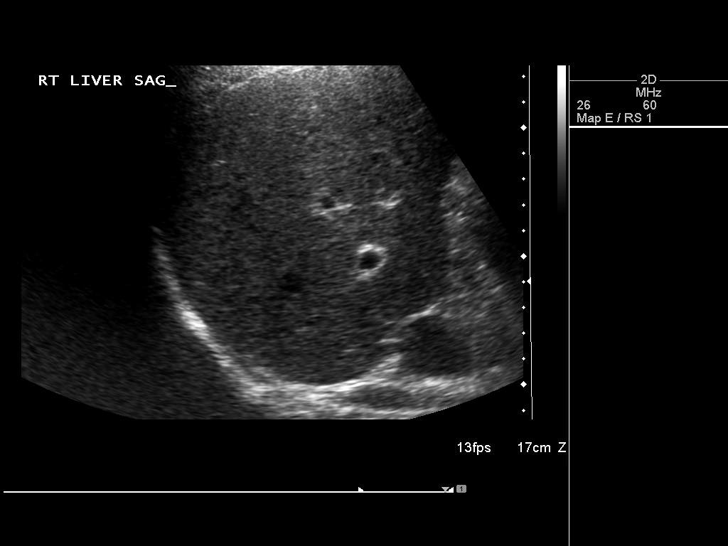
[im 17/37]
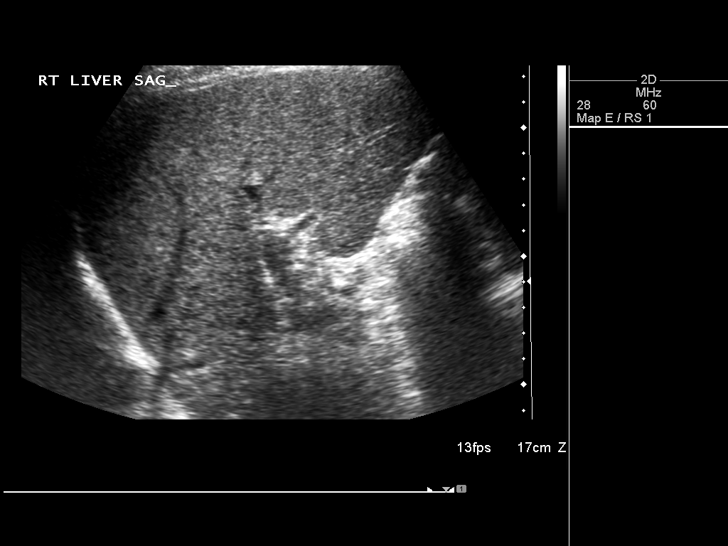
[im 20/37]
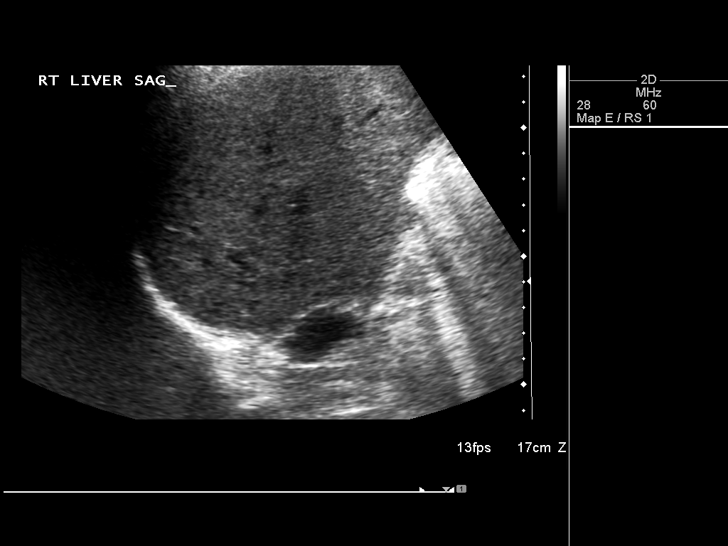
[im 23/37]
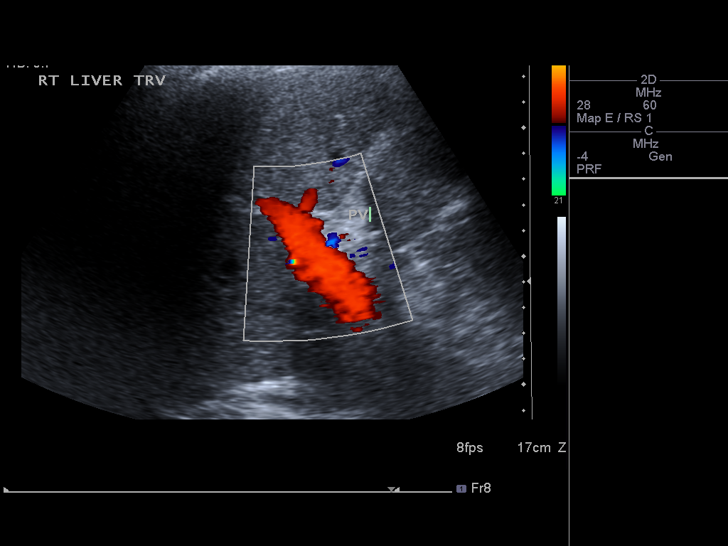
[im 25/37]
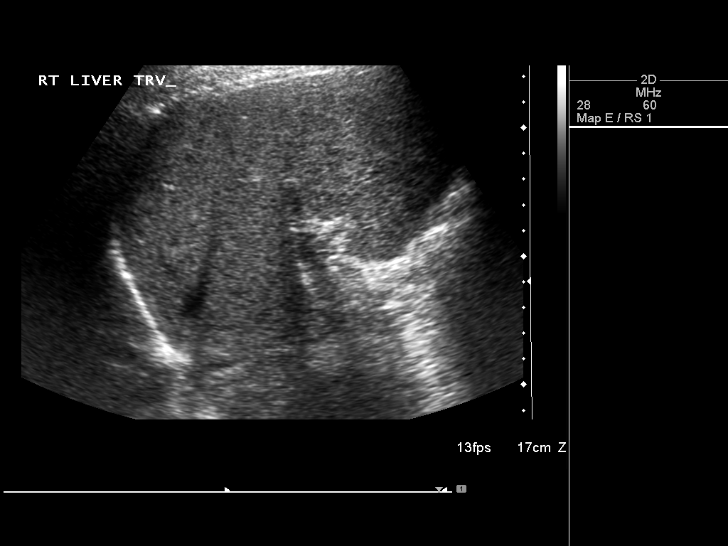
[im 28/37]
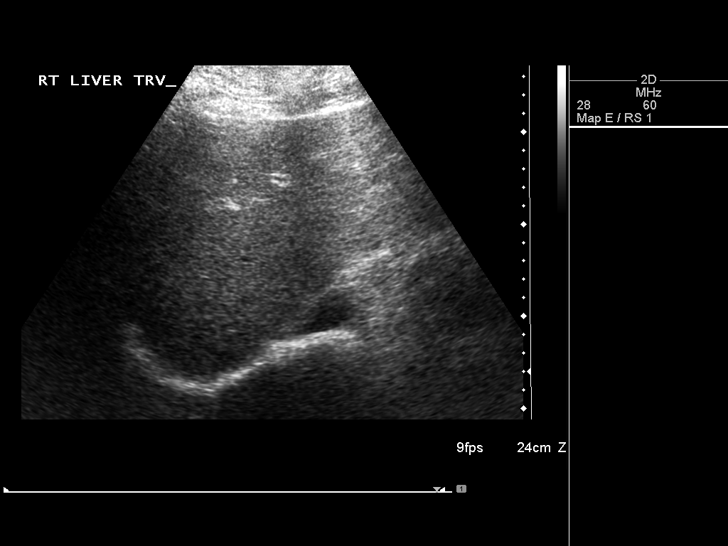
[im 31/37]
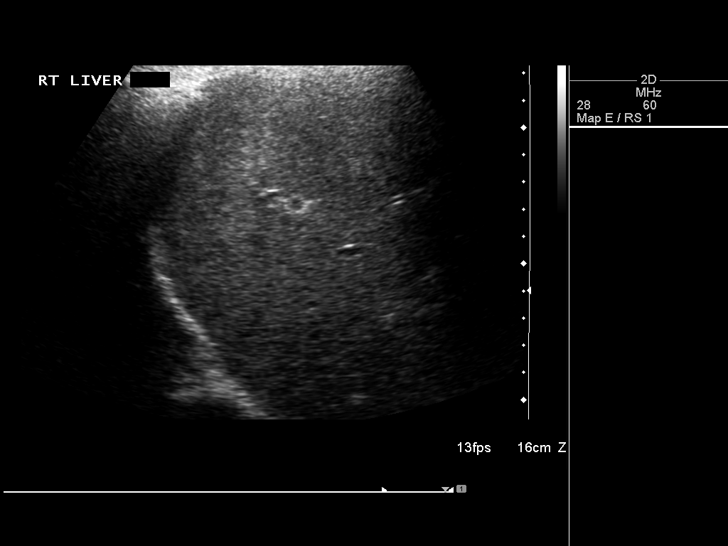
[im 34/37]
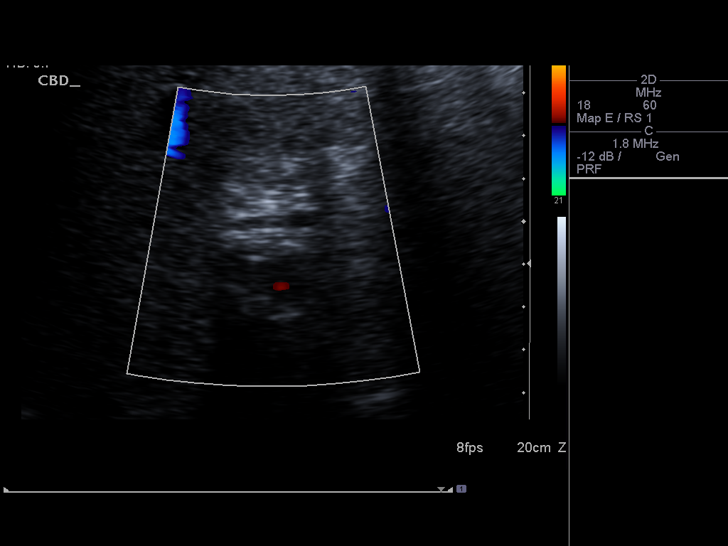
[im 37/37]
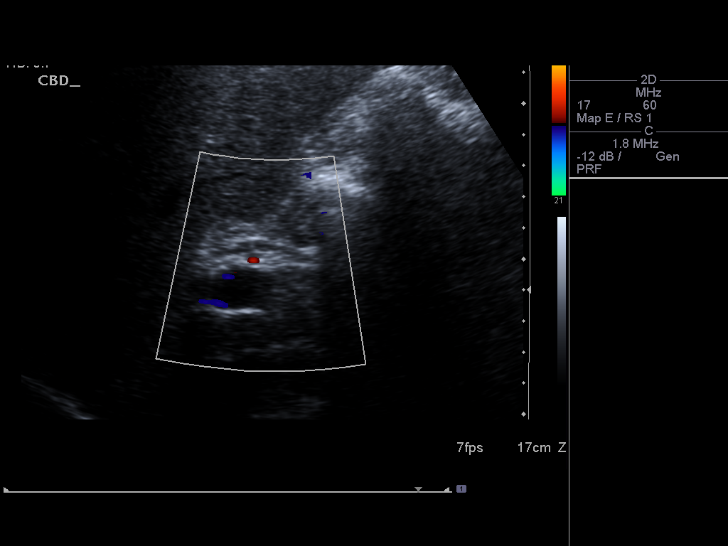

[14 of 25 positions shown; findings below may reference images not displayed]

FINDINGS: There is no evidence of focal hepatic lesion.  No duct
dilatation.  Normal liver parenchyma.

The common bile duct is upper lungs normal at 5 mm following
cholecystectomy.
IMPRESSION: No evidence hepatoma on the ultrasound screening exam.

## 2016-12-08 ENCOUNTER — Emergency Department (HOSPITAL_COMMUNITY)
Admission: EM | Admit: 2016-12-08 | Discharge: 2016-12-08 | Disposition: A | Discharge status: Home / Self Care | Payer: Medicaid Other | Attending: Emergency Medicine | Admitting: Emergency Medicine

## 2016-12-08 ENCOUNTER — Encounter (HOSPITAL_COMMUNITY): Payer: Self-pay

## 2016-12-08 DIAGNOSIS — M79671 Pain in right foot: Secondary | ICD-10-CM | POA: Diagnosis present

## 2016-12-08 DIAGNOSIS — G8929 Other chronic pain: Secondary | ICD-10-CM | POA: Insufficient documentation

## 2016-12-08 DIAGNOSIS — F17210 Nicotine dependence, cigarettes, uncomplicated: Secondary | ICD-10-CM | POA: Diagnosis not present

## 2016-12-08 MED ORDER — HYDROCODONE-ACETAMINOPHEN 5-325 MG PO TABS
1.0000 | ORAL_TABLET | Freq: Once | ORAL | Status: AC
  Administered 2016-12-08: 1 via ORAL
  Filled 2016-12-08: qty 1

## 2016-12-08 MED ORDER — DICLOFENAC SODIUM 50 MG PO TBEC: 50.0000 mg | DELAYED_RELEASE_TABLET | Freq: Two times a day (BID) | ORAL | 0 refills | Status: DC

## 2016-12-08 NOTE — ED Notes (Signed)
See EDP assessment 
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
## 2016-12-08 NOTE — Discharge Instructions (Signed)
Follow up with Dr. Amalia Hailey, take to your primary care doctor about pain management.
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
## 2016-12-08 NOTE — ED Notes (Signed)
NO ANSWER IN LOBBY

## 2017-11-25 ENCOUNTER — Other Ambulatory Visit: Payer: Self-pay

## 2017-11-25 ENCOUNTER — Encounter (HOSPITAL_COMMUNITY): Payer: Self-pay

## 2017-11-25 ENCOUNTER — Emergency Department (HOSPITAL_COMMUNITY): Payer: Medicaid Other

## 2017-11-25 ENCOUNTER — Emergency Department (HOSPITAL_COMMUNITY)
Admission: EM | Admit: 2017-11-25 | Discharge: 2017-11-26 | Disposition: A | Discharge status: Home / Self Care | Payer: Medicaid Other | Attending: Emergency Medicine | Admitting: Emergency Medicine

## 2017-11-25 DIAGNOSIS — Z79899 Other long term (current) drug therapy: Secondary | ICD-10-CM | POA: Diagnosis not present

## 2017-11-25 DIAGNOSIS — Z653 Problems related to other legal circumstances: Secondary | ICD-10-CM | POA: Diagnosis not present

## 2017-11-25 DIAGNOSIS — F1910 Other psychoactive substance abuse, uncomplicated: Secondary | ICD-10-CM

## 2017-11-25 DIAGNOSIS — Y929 Unspecified place or not applicable: Secondary | ICD-10-CM | POA: Diagnosis not present

## 2017-11-25 DIAGNOSIS — Z720 Tobacco use: Secondary | ICD-10-CM

## 2017-11-25 DIAGNOSIS — F319 Bipolar disorder, unspecified: Secondary | ICD-10-CM | POA: Insufficient documentation

## 2017-11-25 DIAGNOSIS — F419 Anxiety disorder, unspecified: Secondary | ICD-10-CM | POA: Diagnosis not present

## 2017-11-25 DIAGNOSIS — R45851 Suicidal ideations: Secondary | ICD-10-CM | POA: Diagnosis not present

## 2017-11-25 DIAGNOSIS — Z046 Encounter for general psychiatric examination, requested by authority: Secondary | ICD-10-CM | POA: Diagnosis not present

## 2017-11-25 DIAGNOSIS — Y998 Other external cause status: Secondary | ICD-10-CM | POA: Diagnosis not present

## 2017-11-25 DIAGNOSIS — S60221A Contusion of right hand, initial encounter: Secondary | ICD-10-CM

## 2017-11-25 DIAGNOSIS — Z008 Encounter for other general examination: Secondary | ICD-10-CM

## 2017-11-25 DIAGNOSIS — Z9049 Acquired absence of other specified parts of digestive tract: Secondary | ICD-10-CM | POA: Diagnosis not present

## 2017-11-25 DIAGNOSIS — R4689 Other symptoms and signs involving appearance and behavior: Secondary | ICD-10-CM

## 2017-11-25 DIAGNOSIS — Z87891 Personal history of nicotine dependence: Secondary | ICD-10-CM | POA: Insufficient documentation

## 2017-11-25 DIAGNOSIS — S6991XA Unspecified injury of right wrist, hand and finger(s), initial encounter: Secondary | ICD-10-CM | POA: Diagnosis present

## 2017-11-25 DIAGNOSIS — Y9389 Activity, other specified: Secondary | ICD-10-CM | POA: Diagnosis not present

## 2017-11-25 DIAGNOSIS — R456 Violent behavior: Secondary | ICD-10-CM | POA: Diagnosis not present

## 2017-11-25 DIAGNOSIS — F1994 Other psychoactive substance use, unspecified with psychoactive substance-induced mood disorder: Secondary | ICD-10-CM | POA: Diagnosis present

## 2017-11-25 DIAGNOSIS — F1520 Other stimulant dependence, uncomplicated: Secondary | ICD-10-CM | POA: Diagnosis present

## 2017-11-25 DIAGNOSIS — F1514 Other stimulant abuse with stimulant-induced mood disorder: Secondary | ICD-10-CM | POA: Diagnosis not present

## 2017-11-25 LAB — COMPREHENSIVE METABOLIC PANEL
ALT: 78 U/L — ABNORMAL HIGH (ref 17–63)
AST: 39 U/L (ref 15–41)
Albumin: 3.7 g/dL (ref 3.5–5.0)
Alkaline Phosphatase: 71 U/L (ref 38–126)
Anion gap: 9 (ref 5–15)
BUN: 9 mg/dL (ref 6–20)
CO2: 23 mmol/L (ref 22–32)
Calcium: 9.1 mg/dL (ref 8.9–10.3)
Chloride: 108 mmol/L (ref 101–111)
Creatinine, Ser: 0.89 mg/dL (ref 0.61–1.24)
GFR calc Af Amer: >60 mL/min (ref >60)
GFR calc non Af Amer: >60 mL/min (ref >60)
GLUCOSE: 131 mg/dL — AB (ref 65–99)
POTASSIUM: 3.7 mmol/L (ref 3.5–5.1)
Sodium: 140 mmol/L (ref 135–145)
Total Bilirubin: <0.1 mg/dL — ABNORMAL LOW (ref 0.3–1.2)
Total Protein: 6.8 g/dL (ref 6.5–8.1)

## 2017-11-25 LAB — CBC
HEMATOCRIT: 40.3 % (ref 39.0–52.0)
Hemoglobin: 13.9 g/dL (ref 13.0–17.0)
MCH: 30.5 pg (ref 26.0–34.0)
MCHC: 34.5 g/dL (ref 30.0–36.0)
MCV: 88.4 fL (ref 78.0–100.0)
Platelets: 340 10*3/uL (ref 150–400)
RBC: 4.56 MIL/uL (ref 4.22–5.81)
RDW: 12.8 % (ref 11.5–15.5)
WBC: 13.3 10*3/uL — AB (ref 4.0–10.5)

## 2017-11-25 LAB — RAPID URINE DRUG SCREEN, HOSP PERFORMED
AMPHETAMINES: POSITIVE — AB
BARBITURATES: NOT DETECTED
BENZODIAZEPINES: POSITIVE — AB
COCAINE: NOT DETECTED
Opiates: NOT DETECTED
Tetrahydrocannabinol: NOT DETECTED

## 2017-11-25 LAB — ETHANOL: Alcohol, Ethyl (B): <10 mg/dL (ref <10)

## 2017-11-25 LAB — ACETAMINOPHEN LEVEL: Acetaminophen (Tylenol), Serum: <10 ug/mL — ABNORMAL LOW (ref 10–30)

## 2017-11-25 LAB — SALICYLATE LEVEL: Salicylate Lvl: <7 mg/dL (ref 2.8–30.0)

## 2017-11-25 MED ORDER — ACETAMINOPHEN 325 MG PO TABS: 650.0000 mg | ORAL_TABLET | ORAL | Status: DC | PRN

## 2017-11-25 MED ORDER — ZOLPIDEM TARTRATE 5 MG PO TABS
5.0000 mg | ORAL_TABLET | Freq: Every evening | ORAL | Status: DC | PRN
  Administered 2017-11-25: 5 mg via ORAL
  Filled 2017-11-25: qty 1

## 2017-11-25 MED ORDER — ALUM & MAG HYDROXIDE-SIMETH 200-200-20 MG/5ML PO SUSP: 30.0000 mL | Freq: Four times a day (QID) | ORAL | Status: DC | PRN

## 2017-11-25 MED ORDER — NICOTINE 21 MG/24HR TD PT24
21.0000 mg | MEDICATED_PATCH | Freq: Every day | TRANSDERMAL | Status: DC
  Administered 2017-11-26: 21 mg via TRANSDERMAL
  Filled 2017-11-25: qty 1

## 2017-11-25 MED ORDER — ONDANSETRON HCL 4 MG PO TABS: 4.0000 mg | ORAL_TABLET | Freq: Three times a day (TID) | ORAL | Status: DC | PRN
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
## 2017-11-25 NOTE — ED Notes (Signed)
TTS in progress 
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
## 2017-11-25 NOTE — ED Notes (Signed)
Bed: MNO17 Expected date:  Expected time:  Means of arrival:  Comments: Hall Busing

## 2017-11-25 NOTE — ED Notes (Signed)
[employer] officers reported that patient has warrants and order to disclosed is being issued.
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
## 2017-11-25 NOTE — ED Notes (Signed)
Patient and belongings have been wanded by security. One bag of clothing (including cell phone) in ED cupboard. Pt in paper scrubs. Law enforcement at bedside.

## 2017-11-25 NOTE — ED Notes (Signed)
Patient reports SI no plan and denies HI and AVH at this time. Plan of care discussed. Encouragement and support provided and safety maintain. Q 15 safety checks in place and video monitoring

## 2017-11-26 DIAGNOSIS — Z87891 Personal history of nicotine dependence: Secondary | ICD-10-CM

## 2017-11-26 DIAGNOSIS — F1520 Other stimulant dependence, uncomplicated: Secondary | ICD-10-CM | POA: Diagnosis present

## 2017-11-26 DIAGNOSIS — R45851 Suicidal ideations: Secondary | ICD-10-CM | POA: Diagnosis not present

## 2017-11-26 DIAGNOSIS — F1514 Other stimulant abuse with stimulant-induced mood disorder: Secondary | ICD-10-CM | POA: Diagnosis not present

## 2017-11-26 DIAGNOSIS — Z653 Problems related to other legal circumstances: Secondary | ICD-10-CM

## 2017-11-26 DIAGNOSIS — F419 Anxiety disorder, unspecified: Secondary | ICD-10-CM

## 2017-11-26 DIAGNOSIS — F1994 Other psychoactive substance use, unspecified with psychoactive substance-induced mood disorder: Secondary | ICD-10-CM | POA: Diagnosis present

## 2017-11-26 MED ORDER — GABAPENTIN 300 MG PO CAPS
300.0000 mg | ORAL_CAPSULE | Freq: Three times a day (TID) | ORAL | Status: DC
  Administered 2017-11-26: 300 mg via ORAL
  Filled 2017-11-26: qty 1

## 2017-11-26 MED ORDER — HYDROXYZINE HCL 25 MG PO TABS
50.0000 mg | ORAL_TABLET | Freq: Three times a day (TID) | ORAL | Status: DC
  Administered 2017-11-26: 50 mg via ORAL
  Filled 2017-11-26: qty 2
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
## 2017-11-26 NOTE — [facility] Suicide Risk Assessment (Signed)
Suicide Risk Assessment  Discharge Assessment   [facility] Discharge Suicide Risk Assessment   Principal Problem: Substance induced mood disorder [facility]) Discharge Diagnoses:  Patient Active Problem List   Diagnosis Date Noted  . Methamphetamine use disorder, moderate ([place]) [F15.20] 11/26/2017    Priority: High  . Substance induced mood disorder [facility]) [F19.94] 11/26/2017    Priority: High    Total Time spent with patient: 45 minutes   Musculoskeletal: Strength & Muscle Tone: within normal limits Gait & Station: normal Patient leans: N/A  Psychiatric Specialty Exam: Physical Exam  Constitutional: He is oriented to person, place, and time. He appears well-developed and well-nourished.  HENT:  Head: Normocephalic.  Neck: Normal range of motion.  Respiratory: Effort normal.  Musculoskeletal: Normal range of motion.  Neurological: He is alert and oriented to person, place, and time.  Psychiatric: He has a normal mood and affect. His speech is normal and behavior is normal. Judgment and thought content normal. Cognition and memory are normal.    Review of Systems  Psychiatric/Behavioral: Positive for substance abuse.  All other systems reviewed and are negative.   Blood pressure (!) 138/91, pulse 91, temperature 98.3 F (36.8 C), temperature source Oral, resp. rate 17, height 6' (1.829 m), weight 90.7 kg (200 lb), SpO2 99 %.Body mass index is 27.12 kg/m.  General Appearance: Casual  Eye Contact:  Good  Speech:  Normal Rate  Volume:  Normal  Mood:  Euthymic  Affect:  Congruent  Thought Process:  Coherent and Descriptions of Associations: Intact  Orientation:  Full (Time, Place, and Person)  Thought Content:  WDL and Logical  Suicidal Thoughts:  No  Homicidal Thoughts:  No  Memory:  Immediate;   Good Recent;   Good Remote;   Good  Judgement:  Fair  Insight:  Fair  Psychomotor Activity:  Normal  Concentration:  Concentration: Good and Attention Span: Good  Recall:  Good   Fund of Knowledge:  Fair  Language:  Good  Akathisia:  No  Handed:  Right  AIMS (if indicated):     Assets:  Housing Leisure Time Physical Health Resilience Social Support  ADL's:  Intact  Cognition:  WNL  Sleep:      Mental Status Per Nursing Assessment::   On Admission:   meth abuse with suicidal ideations  Demographic Factors:  Male and Caucasian  Loss Factors: Legal issues  Historical Factors: NA  Risk Reduction Factors:   Sense of responsibility to family, Living with another person, especially a relative and Positive social support  Continued Clinical Symptoms:  None  Cognitive Features That Contribute To Risk:  None    Suicide Risk:  Minimal: No identifiable suicidal ideation.  Patients presenting with no risk factors but with morbid ruminations; may be classified as minimal risk based on the severity of the depressive symptoms    Plan Of Care/Follow-up recommendations:  Activity:  as tolerated Diet:  heart healthy diet  LORD, JAMISON, NP 11/26/2017, 11:10 AM

## 2017-11-26 NOTE — BH Assessment (Signed)
[facility] Assessment Progress Note  Per Buford Dresser, DO, this pt does not require psychiatric hospitalization at this time.  Pt presents under IVC initiated by law enforcement, which Dr Mariea Clonts has rescinded.  No discharge instructions are required.  Pt's nurse, Diane, has been notified.  Jalene Mullet, [place] Triage Specialist [phone]

## 2017-11-26 NOTE — ED Notes (Signed)
Pt has been attention seeking this morning and anxious about the plan for him and about his potential for having withdrawal symptoms. He has been in contact with family members.

## 2017-11-26 NOTE — ED Notes (Signed)
Pt escorted from hospital by [employer] who had appropriate paper work in his chart for them to take him into custody related to current warrants. Pt was not combative.  All belongings and his discharge instructions were given to [employer].
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
## 2021-03-30 ENCOUNTER — Encounter (HOSPITAL_BASED_OUTPATIENT_CLINIC_OR_DEPARTMENT_OTHER): Payer: Self-pay | Admitting: Obstetrics and Gynecology

## 2021-03-30 ENCOUNTER — Other Ambulatory Visit: Payer: Self-pay

## 2021-03-30 ENCOUNTER — Emergency Department (HOSPITAL_BASED_OUTPATIENT_CLINIC_OR_DEPARTMENT_OTHER)
Admission: EM | Admit: 2021-03-30 | Discharge: 2021-03-30 | Disposition: A | Discharge status: Home / Self Care | Payer: Medicaid Other | Attending: Emergency Medicine | Admitting: Emergency Medicine

## 2021-03-30 DIAGNOSIS — Z87891 Personal history of nicotine dependence: Secondary | ICD-10-CM | POA: Insufficient documentation

## 2021-03-30 DIAGNOSIS — L03115 Cellulitis of right lower limb: Secondary | ICD-10-CM | POA: Diagnosis not present

## 2021-03-30 DIAGNOSIS — L02415 Cutaneous abscess of right lower limb: Secondary | ICD-10-CM | POA: Insufficient documentation

## 2021-03-30 DIAGNOSIS — R030 Elevated blood-pressure reading, without diagnosis of hypertension: Secondary | ICD-10-CM

## 2021-03-30 MED ORDER — LIDOCAINE-EPINEPHRINE (PF) 2 %-1:200000 IJ SOLN
20.0000 mL | Freq: Once | INTRAMUSCULAR | Status: AC
  Administered 2021-03-30: 20 mL
  Filled 2021-03-30: qty 20

## 2021-03-30 MED ORDER — DOXYCYCLINE HYCLATE 100 MG PO TABS
100.0000 mg | ORAL_TABLET | Freq: Once | ORAL | Status: AC
  Administered 2021-03-30: 18:00:00 100 mg via ORAL
  Filled 2021-03-30: qty 1

## 2021-03-30 MED ORDER — ACETAMINOPHEN 500 MG PO TABS
1000.0000 mg | ORAL_TABLET | Freq: Once | ORAL | Status: AC
  Administered 2021-03-30: 18:00:00 1000 mg via ORAL
  Filled 2021-03-30: qty 2

## 2021-03-30 MED ORDER — DOXYCYCLINE HYCLATE 100 MG PO CAPS: 100.0000 mg | ORAL_CAPSULE | Freq: Two times a day (BID) | ORAL | 0 refills | Status: AC

## 2021-03-30 NOTE — Discharge Instructions (Signed)
It was our pleasure to provide your ER care today - we hope that you feel better.  Keep area very clean. Take antibiotic as prescribed. Take acetaminophen or ibuprofen as need.   Follow up with primary care doctor, or urgent care, in two days time for wound check and packing removal.   Your blood pressure is high today - follow up with primary care doctor in the coming week.   Return to ER if worse, new symptoms, high fevers, severe pain, spreading redness, or other concern.

## 2021-03-30 NOTE — ED Triage Notes (Signed)
Patient has an area on his right thigh that is suspicious for cellulitis. Patient reports that it started out as a pimple or insect bite that has since gotten worse

## 2021-03-30 NOTE — ED Provider Notes (Signed)
[facility] EMERGENCY DEPT Provider Note   CSN: [id number] Arrival date & time: 03/30/21  1709     History Chief Complaint  Patient presents with   Cellulitis    Evan Thompson is a 40 y.o. male.  Patient c/o abscess to right leg. Symptoms acute onset a few days ago with small red area. Since then spreading redness and increased swelling to area, moderate, constant, persistent. Denies specific bite or sting. No other trauma to area. No injection/drug use to area. No fever or chills. No nv. States otherwise does not feel sick or ill. No hx mrsa. States has had tetanus imm within past 5 years.   The history is provided by the patient.      Past Medical History:  Diagnosis Date   Anxiety    Bipolar 1 disorder [facility])    Depression     Patient Active Problem List   Diagnosis Date Noted   Methamphetamine use disorder, moderate ([place]) 11/26/2017   Substance induced mood disorder ([place]) 11/26/2017    Past Surgical History:  Procedure Laterality Date   CHOLECYSTECTOMY         No family history on file.  Social History   Tobacco Use   Smoking status: Former    Packs/day: 0.50    Pack years: 0.00    Types: Cigarettes   Smokeless tobacco: Never   Tobacco comments:    Denies 11/25/17  Vaping Use   Vaping Use: Every day   Substances: Nicotine, Flavoring  Substance Use Topics   Alcohol use: No   Drug use: Yes    Types: Methamphetamines    Comment: Valium    Home Medications Prior to Admission medications   Medication Sig Start Date End Date Taking? Authorizing Provider  ibuprofen (ADVIL,MOTRIN) 200 MG tablet Take 400 mg by mouth every 6 (six) hours as needed for moderate pain.   Yes [provider]  naproxen sodium (ALEVE) 220 MG tablet Take 220 mg by mouth daily as needed (pain).   Yes [provider]  acetaminophen (TYLENOL) 650 MG CR tablet Take 650 mg by mouth every 8 (eight) hours as needed for pain.    [provider]   gabapentin (NEURONTIN) 300 MG capsule Take 300 mg by mouth 3 (three) times daily. 02/21/16   [provider]  ranitidine (ZANTAC) 75 MG tablet Take 75 mg by mouth 2 (two) times daily.    [provider]    Allergies    Patient has no known allergies.  Review of Systems   Review of Systems  Constitutional:  Negative for chills and fever.  Gastrointestinal:  Negative for nausea and vomiting.  Skin:  Negative for rash.       Abscess right leg.   Neurological:  Negative for weakness and numbness.   Physical Exam Updated Vital Signs BP (!) 153/113   Pulse 99   Temp 98.7 F (37.1 C) (Oral)   Resp 18   SpO2 98%   Physical Exam Vitals and nursing note reviewed.  Constitutional:      Appearance: Normal appearance. He is well-developed.  HENT:     Head: Atraumatic.     Nose: Nose normal.     Mouth/Throat:     Mouth: Mucous membranes are moist.  Eyes:     General: No scleral icterus.    Conjunctiva/sclera: Conjunctivae normal.  Neck:     Trachea: No tracheal deviation.  Cardiovascular:     Rate and Rhythm: Normal rate.  Pulses: Normal pulses.  Pulmonary:     Effort: Pulmonary effort is normal. No accessory muscle usage or respiratory distress.  Genitourinary:    Comments: No cva tenderness. Musculoskeletal:        General: No swelling.     Cervical back: Neck supple.     Comments: ~ 3 cm abscess to right distal thigh, w surrounding erythema/mild cellulitis. No devitalized or necrotic tissue. No crepitus. Distal pulses palp.   Skin:    General: Skin is warm and dry.     Findings: No rash.  Neurological:     Mental Status: He is alert.     Comments: Alert, speech clear.   Psychiatric:        Mood and Affect: Mood normal.    ED Results / Procedures / Treatments   Labs (all labs ordered are listed, but only abnormal results are displayed) Labs Reviewed - No data to display  EKG None  Radiology No results found.  Procedures .Marland KitchenIncision and  Drainage  Date/Time: 03/30/2021 6:03 PM Performed by: Lajean Saver, MD Authorized by: Lajean Saver, MD   Consent:    Consent given by:  Patient Location:    Type:  Abscess   Location:  Lower extremity   Lower extremity location:  Leg   Leg location:  R upper leg Pre-procedure details:    Skin preparation:  Povidone-iodine Anesthesia:    Anesthesia method:  Local infiltration   Local anesthetic:  Lidocaine 2% WITH epi Procedure type:    Complexity:  Complex Procedure details:    Incision types:  Single straight   Incision depth:  Subcutaneous   Wound management:  Probed and deloculated and irrigated with saline   Drainage:  Purulent   Drainage amount:  Moderate   Wound treatment:  Wound left open and drain placed   Packing materials:  1/4 in iodoform gauze Post-procedure details:    Procedure completion:  Tolerated well, no immediate complications   Medications Ordered in ED Medications  lidocaine-EPINEPHrine (XYLOCAINE W/EPI) 2 %-1:200000 (PF) injection 20 mL (has no administration in time range)    ED Course  I have reviewed the triage vital signs and the nursing notes.  Pertinent labs & imaging results that were available during my care of the patient were reviewed by me and considered in my medical decision making (see chart for details).    MDM Rules/Calculators/A&P                         I and D of abscess.   Reviewed nursing notes and prior charts for additional history.   With surrounding erythema/cellulitis will give abx rx.  Acetaminophen po. Doxycycline po.    Final Clinical Impression(s) / ED Diagnoses Final diagnoses:  None    Rx / DC Orders ED Discharge Orders     None        Lajean Saver, MD 03/30/21 [phone]
